# Patient Record
Sex: Male | Born: 1990 | Race: White | Hispanic: No | Marital: Single | Smoking: Never smoker
Health system: Southern US, Community
[De-identification: ages and names within clinical notes are randomized; demographics above are authoritative.]

## PROBLEM LIST (undated history)

## (undated) DIAGNOSIS — E063 Autoimmune thyroiditis: Secondary | ICD-10-CM

## (undated) DIAGNOSIS — E039 Hypothyroidism, unspecified: Secondary | ICD-10-CM

## (undated) DIAGNOSIS — E230 Hypopituitarism: Secondary | ICD-10-CM

## (undated) DIAGNOSIS — E049 Nontoxic goiter, unspecified: Secondary | ICD-10-CM

## (undated) HISTORY — DX: Hypopituitarism: E23.0

## (undated) HISTORY — DX: Autoimmune thyroiditis: E06.3

## (undated) HISTORY — PX: ADENOIDECTOMY: SUR15

## (undated) HISTORY — DX: Nontoxic goiter, unspecified: E04.9

## (undated) HISTORY — PX: TYMPANOSTOMY TUBE PLACEMENT: SHX32

## (undated) HISTORY — DX: Hypothyroidism, unspecified: E03.9

---
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
## 2009-06-03 IMAGING — CR DG BONE AGE
1 series · 1 of 1 positions shown · non-contrast
Comparison: 06/16/2006

CLINICAL DATA: Growth delay.  Growth hormone deficiency.

BONE AGE
TECHNIQUE: AP radiographs of the hand and wrist are correlated
with the developmental standards of Greulich and Pyle.

[x hand pa left]
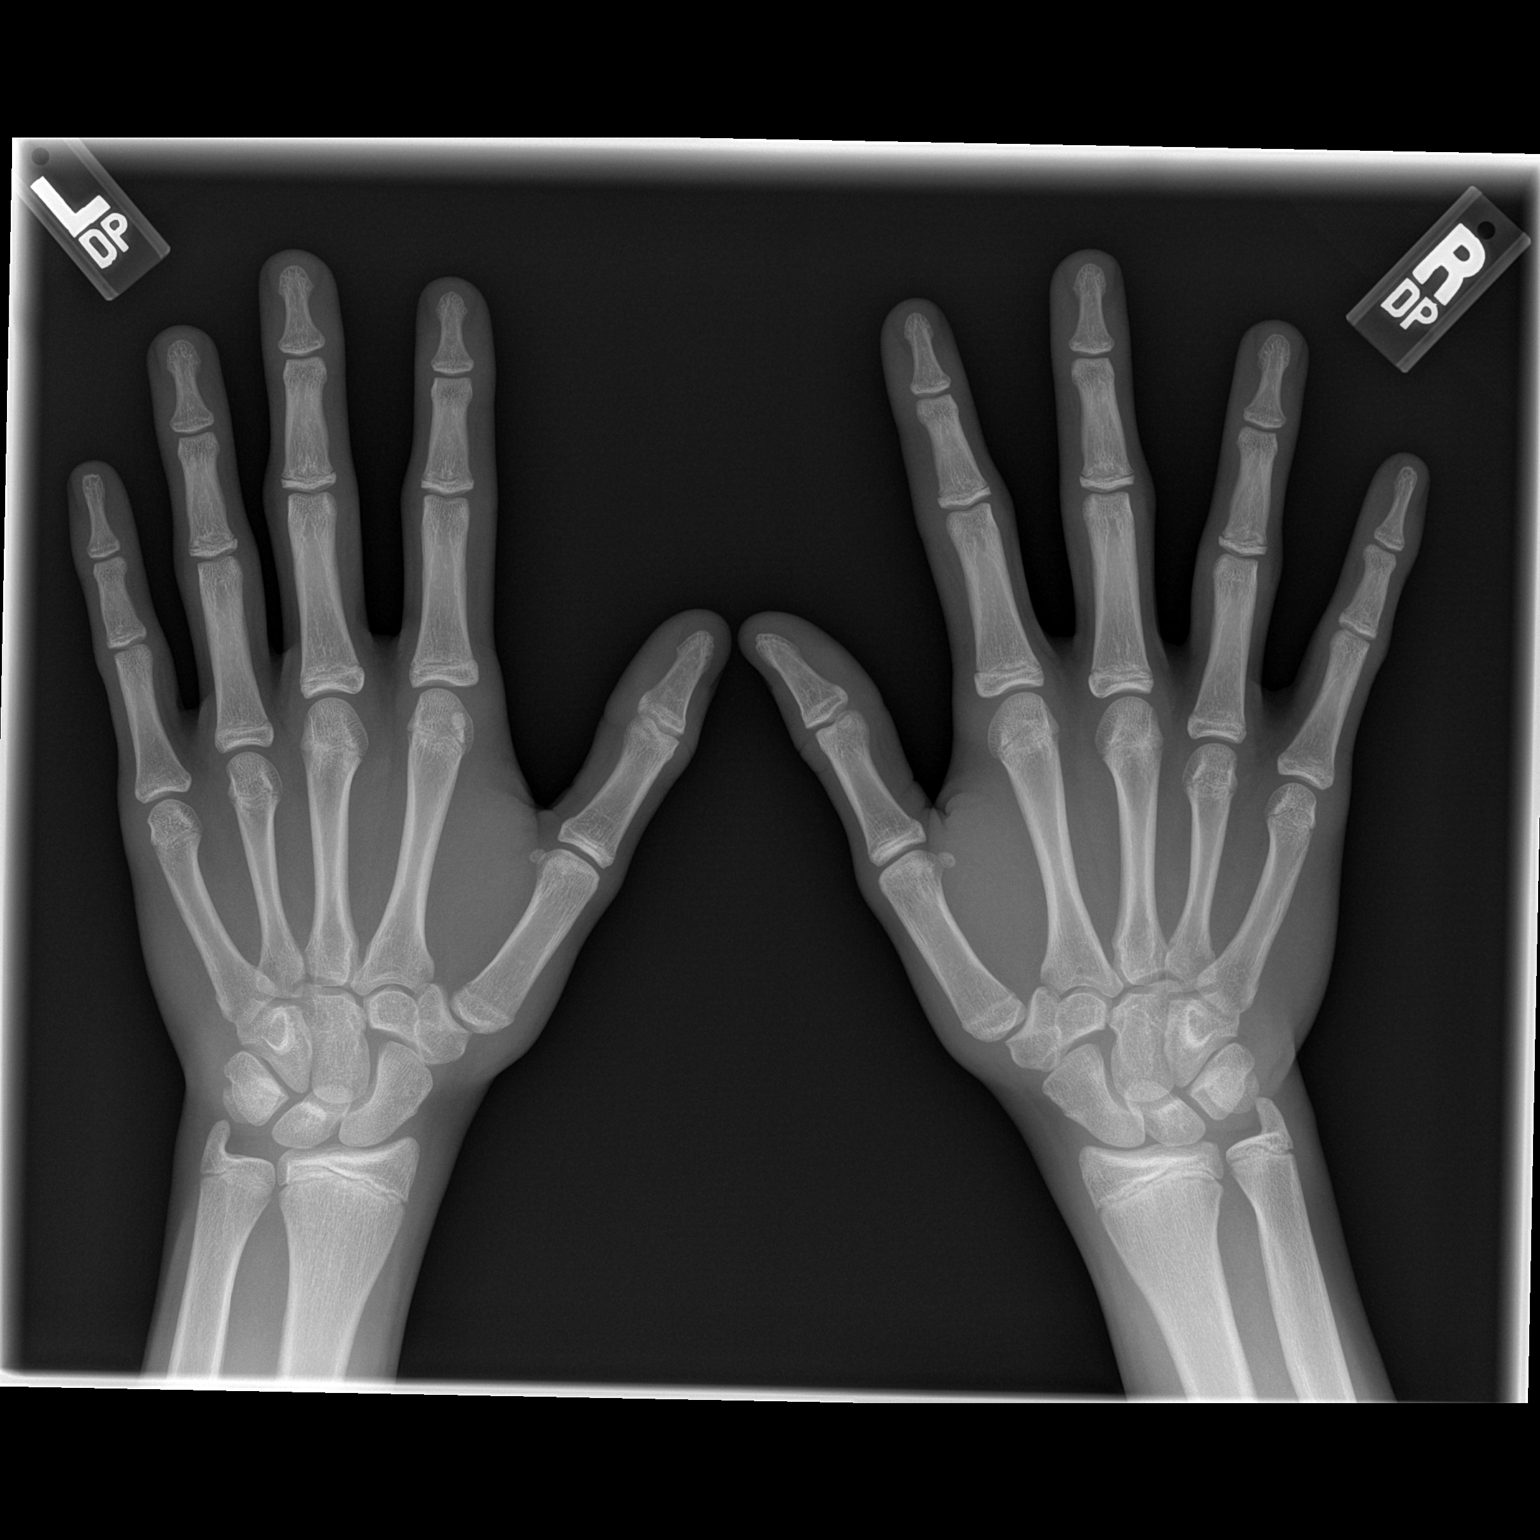

[1 of 1 positions shown; findings below may reference images not displayed]

FINDINGS: The chronological age of the patient is 17 years.  The
skeletal age most closely corresponds to that at the a 15-year-7-
month male. The standard deviation for an 18-year-old male is
approximately 16 months.  Therefore the current bone age is at the
limits of two standard deviations below the norm for chronological
age.
IMPRESSION: Skeletal age is barely within two standard deviations for the
chronological age.

## 2009-06-26 ENCOUNTER — Ambulatory Visit: Payer: Self-pay | Admitting: "Endocrinology

## 2010-01-22 ENCOUNTER — Ambulatory Visit: Payer: Self-pay | Admitting: "Endocrinology

## 2010-07-31 ENCOUNTER — Encounter: Payer: Self-pay | Admitting: Pediatrics

## 2010-07-31 DIAGNOSIS — E038 Other specified hypothyroidism: Secondary | ICD-10-CM

## 2010-08-13 ENCOUNTER — Other Ambulatory Visit: Payer: Self-pay | Admitting: "Endocrinology

## 2010-08-13 LAB — CLIENT PROFILE 3332
Free T4: 1.26 ng/dL (ref 0.80–1.80)
T3, Free: 3.2 pg/mL (ref 2.3–4.2)
TSH: 3.922 u[IU]/mL (ref 0.350–4.500)

## 2010-08-18 ENCOUNTER — Ambulatory Visit (INDEPENDENT_AMBULATORY_CARE_PROVIDER_SITE_OTHER): Payer: PRIVATE HEALTH INSURANCE | Admitting: "Endocrinology

## 2010-08-18 ENCOUNTER — Encounter: Payer: Self-pay | Admitting: "Endocrinology

## 2010-08-18 VITALS — BP 122/59 | HR 65 | Wt 157.4 lb

## 2010-08-18 DIAGNOSIS — E038 Other specified hypothyroidism: Secondary | ICD-10-CM

## 2010-08-18 DIAGNOSIS — E063 Autoimmune thyroiditis: Secondary | ICD-10-CM

## 2010-08-18 DIAGNOSIS — E049 Nontoxic goiter, unspecified: Secondary | ICD-10-CM

## 2010-08-18 DIAGNOSIS — E230 Hypopituitarism: Secondary | ICD-10-CM | POA: Insufficient documentation

## 2010-08-18 MED ORDER — LEVOTHYROXINE SODIUM 25 MCG PO TABS: 25.0000 ug | ORAL_TABLET | Freq: Every day | ORAL | Status: DC

## 2010-08-18 NOTE — Patient Instructions (Signed)
Please have thyroid lab tests drawn in early September and about one week prior to next appointment in December-January.

## 2010-08-18 NOTE — Progress Notes (Addendum)
Chief complaint: Followup hypothyroid, Hashimoto's thyroiditis, oiter, prior growth hormone deficiency.   History of present illness: The patient is a 20 year old white male. He was accompanied by his mother. 1. The patient was referred to me on 12/26/03 for evaluation of growth delay by his then pediatrician Dr. Williemae Area. The patient was 20 years old. On exam his height was at less than the 5th percentile. His weight was also less than the 5th percentile. He had a 20-25 gram goiter. His pubertal development was relatively delayed for his chronologic age. His thyroid function tests were normal. His bone age was 60 at a chronologic age of 34. His IGF-1 was 233 with normals being 202-957. His testosterone was 64.55, which was consistent with his early pubertal status. Over the next 6 months he grew very little. Growth hormone stimulation test done on 08/24/04 showed a initial growth hormone value of 0.10 and a peak growth hormone value of 0.56 after stimulation by levodopa. A second growth hormone stimulation test on 09/16/04 showed a baseline growth hormone of 0.26 and a stimulated value of 6.97, using levodopa and propranolol as a stimulating agent. 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 He was started on growth hormone in late 2006. In January 2007 the patient was also mildly hypothyroid. At that point he was started on Synthroid 25 mcg per day. The patient continued growth hormone treatment with gradually increasing doses of growth hormone up until 02/28/08. At that point he was a senior in high school. He had achieved a height growth of 69 inches. He decided it was time to stop growth hormone. On 06/26/09,I saw the patient in followup. Since he had not required a dose increase of Synthroid beyond 25 mcg over the preceding 4 years, we elected to stop  the Synthroid to see if perhaps he would make more thyroid hormone on his own. By 12/31/09 his thyroid function tests showed a TSH of 1.921, free T4 of 1.08, and free T3 of 2.9. At that point we agreed to continue him off Synthroid to see whether he would require Synthroid in the future. 2. The patient's last visit here was 5.19.11. In the interim he has been healthy. He had laboratory tests performed on 0 7.0 5.12.  3. PROS: Constitutional: The patient feels well, is healthy, and has no significant complaints. His energy is fine. He sleeps well. His body temperature is normal. Eyes: Vision is good. There are no significant eye complaints. Neck: The patient has no complaints of anterior neck swelling, soreness, tenderness,  pressure, discomfort, or difficulty swallowing.  Heart: Heart rate increases with exercise or other physical activity. The patient has no complaints of palpitations, irregular heat beats, chest pain, or chest pressure. Gastrointestinal: Bowel movents seem normal. The patient has no complaints of excessive hunger, acid reflux, upset stomach, stomach aches or pains, diarrhea, or constipation. Legs: Muscle mass and strength seem normal. There are no complaints of numbness, tingling, burning, or pain. No edema is noted. Feet: There are no obvious foot problems. There are no complaints of numbness, tingling, burning, or pain. No edema is noted.  PMFSH: 1. The patient is entering his third year at [employer]. He is Glass blower/designer in Dance movement psychotherapist.  2. He is currently working out 6 days per week. 3 days per week he goes to  the gymnasium and works on his upper body work. The other 3 days per week he runs.  PHYSICAL EXAMINATION:  Vital signs: BP: 102/59, HR: 59 Weight: 157 lbs (6.4 lb increase since 5.19.11. Constitutional: The patient looks healthy and appears physically and emotionally well.  Eyes: There is no arcus or proptosis.  Mouth: The oropharynx appears  normal. The tongue appears normal. There is normal oral moisture. There is no obvious gingivitis. Neck: There are no bruits present. The thyroid gland appears normal in size. The thyroid gland is approximately 25-30 grams in size. The consistency of the thyroid gland is normal. There is no thyroid tenderness to palpation. Lungs: The lungs are clear. Air movement is good. Heart: The heart rhythm and rate appear normal. Heart sounds S1 and S2 are normal. I do not appreciate any pathologic heart murmurs. Abdomen: The abdominal size is normal. Bowel sounds are normal. The abdomen is soft and non-tender. There is no obviously palpable hepatomegaly, splenomegaly, or other masses.  Arms: Muscle mass appears appropriate for age. Hands: There is no obvious tremor. Phalangeal and metacarpophalangeal joints appear normal. Palms are normal. Legs: Muscle mass appears appropriate for age. There is no edema.  Neurologic: Muscle strength is normal for age and gender  in both the upper and the lower extremities. Muscle tone appears normal. Sensation to touch is normal in the legs.  LABS 08/13/10: TSH was 3.922, free T4 was 1.26, and free T3 was 3.2.   ASSESSMENT: 1. Goiter: The thyroid gland is slightly larger today. 2. Hypothyroid: By the laboratory tests from this past week, the patient has compensated or mild hypothyroidism. I recommended resuming Synthroid therapy. 3. Hashimoto's thyroiditis: This is currently clinically quiescent.  PLAN: 1. Diagnostic: Patient will have thyroid function tests performed in 2 and 6 months. 2. Therapeutic: Patient will begin treatment with 25 mcg of Synthroid per day. 3. Patient education: We discussed the typical course of Hashimoto's thyroiditis and the requirement for larger doses of thyroid hormone over time.  4. Follow-up: We will see the patient in followup appointment in 6 months.  Level of Service: This visit lasted in excess of 40 minutes. More than 50% of the visit  was devoted to counseling.

## 2010-11-13 ENCOUNTER — Other Ambulatory Visit: Payer: Self-pay | Admitting: "Endocrinology

## 2010-11-14 LAB — T4, FREE: Free T4: 1.38 ng/dL (ref 0.80–1.80)

## 2010-11-14 LAB — T3, FREE: T3, Free: 3.2 pg/mL (ref 2.3–4.2)

## 2010-11-14 LAB — TSH: TSH: 3.878 u[IU]/mL (ref 0.350–4.500)

## 2011-01-25 ENCOUNTER — Ambulatory Visit: Payer: PRIVATE HEALTH INSURANCE | Admitting: "Endocrinology

## 2011-02-04 ENCOUNTER — Other Ambulatory Visit: Payer: Self-pay | Admitting: "Endocrinology

## 2011-02-05 ENCOUNTER — Telehealth: Payer: Self-pay | Admitting: *Deleted

## 2011-02-05 LAB — T3, FREE: T3, Free: 3.3 pg/mL (ref 2.3–4.2)

## 2011-02-05 NOTE — Telephone Encounter (Signed)
See below note.

## 2011-02-10 ENCOUNTER — Other Ambulatory Visit: Payer: Self-pay | Admitting: "Endocrinology

## 2011-02-10 ENCOUNTER — Ambulatory Visit: Payer: PRIVATE HEALTH INSURANCE | Admitting: "Endocrinology

## 2011-03-01 ENCOUNTER — Ambulatory Visit: Payer: PRIVATE HEALTH INSURANCE | Admitting: "Endocrinology

## 2011-04-26 ENCOUNTER — Ambulatory Visit: Payer: PRIVATE HEALTH INSURANCE | Admitting: "Endocrinology

## 2011-05-18 ENCOUNTER — Other Ambulatory Visit: Payer: Self-pay | Admitting: *Deleted

## 2011-05-18 DIAGNOSIS — E038 Other specified hypothyroidism: Secondary | ICD-10-CM

## 2011-05-19 ENCOUNTER — Other Ambulatory Visit: Payer: Self-pay | Admitting: "Endocrinology

## 2011-06-23 LAB — TSH: TSH: 1.446 u[IU]/mL (ref 0.350–4.500)

## 2011-06-23 LAB — T4, FREE: Free T4: 1.4 ng/dL (ref 0.80–1.80)

## 2011-06-30 ENCOUNTER — Ambulatory Visit (INDEPENDENT_AMBULATORY_CARE_PROVIDER_SITE_OTHER): Payer: BC Managed Care – PPO | Admitting: "Endocrinology

## 2011-06-30 ENCOUNTER — Encounter: Payer: Self-pay | Admitting: "Endocrinology

## 2011-06-30 VITALS — BP 118/68 | HR 61 | Wt 161.4 lb

## 2011-06-30 DIAGNOSIS — E038 Other specified hypothyroidism: Secondary | ICD-10-CM

## 2011-06-30 DIAGNOSIS — E049 Nontoxic goiter, unspecified: Secondary | ICD-10-CM

## 2011-06-30 DIAGNOSIS — E063 Autoimmune thyroiditis: Secondary | ICD-10-CM

## 2011-06-30 NOTE — Patient Instructions (Signed)
Followup visit in one year. Repeat thyroid function tests in 6 and 12 months. Continue current dose of Synthroid at 37.5 mcg per day.

## 2011-06-30 NOTE — Progress Notes (Signed)
Chief complaint: Follow-up hypothyroid, Hashimoto's thyroiditis, goiter, and prior growth hormone deficiency.   History of present illness: The patient is a 21 year old white male. He was accompanied by his mother. 1. The patient was referred to me on 12/26/03 for evaluation of growth delay by his then pediatrician Dr. Williemae Area. The patient was 21 years old. On exam his height was at less than the 5th percentile. His weight was also less than the 5th percentile. He had a 20-25 gram goiter. His pubertal development was relatively delayed for his chronologic age. His thyroid function tests were normal. His bone age was 78 at a chronologic age of 3. His IGF-1 was 233 with normals being 202-957. His testosterone was 64.55, which was consistent with his early pubertal status. Over the next 6 months he grew very little. A growth hormone stimulation test done on 08/24/04 showed a initial growth hormone value of 0.10 and a peak growth hormone value of 0.56 after stimulation by levodopa. A second growth hormone stimulation test on 09/16/04 showed a baseline growth hormone of 0.26 and a stimulated value of 6.97, using levodopa and propranolol as a stimulating agent. 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 He was started on growth hormone in late 2006. In January 2007 the patient was also mildly hypothyroid. At that point he was started on Synthroid 25 mcg per day. The patient continued growth hormone treatment with gradually increasing doses of growth hormone up until 02/28/08. At that point he was a senior in high school. He had achieved a height growth of 69 inches. He decided it was time to stop growth hormone. On 06/26/09, I saw the patient in follow-up. Since he had not required a dose increase of Synthroid beyond 25 mcg over the preceding 4 years, we  elected to stop the Synthroid to see if perhaps he would make more thyroid hormone on his own. By 12/31/09 his thyroid function tests showed a TSH of 1.921, free T4 of 1.08, and free T3 of 2.9. At that point we agreed to continue him off Synthroid to see whether he would require Synthroid in the future. 2. The patient's last visit here was 7.10.12. His labs from 08/13/10 showed a TSH of 3.922, free T4 1.26, and free T3 3.2. We started him on Synthroid 25 mcg/day. On 12/03/10  His TSH was 3.878, free T4 1.38, and free T3 3.2. We increased the Synthroid dose to 37.5 mcg/day. In the interim he has been healthy.   3. Pertinent Review of Systems: Constitutional: The patient feels "well", is healthy, and has no significant complaints. His energy is fine. He sleeps well. His body temperature is normal. Eyes: Vision is good. There are no significant eye complaints. Neck: The patient has no complaints of anterior neck swelling, soreness, tenderness,  pressure, discomfort, or difficulty swallowing.  Heart: Heart rate increases with exercise or other physical activity. The patient has no complaints of palpitations, irregular heat beats, chest pain, or chest pressure. Gastrointestinal: Bowel movents seem normal. The patient has no complaints of excessive hunger, acid reflux, upset stomach, stomach aches or pains, diarrhea, or constipation. Legs: Muscle mass and strength seem normal. There are no complaints of numbness, tingling, burning, or pain. No edema is noted. Feet: There are no obvious foot problems. There are no complaints of numbness, tingling, burning, or pain.  No edema is noted.  PMFSH: 1. School and work: The patient is finishing his third year at [employer]. 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   2. Activities: He has resumed weight training and cardio recently. He did not exercise much during the academic year.  3. Primary care  provider: Student health  PHYSICAL EXAMINATION:  Vital signs: BP: 118/68, HR: 61 Weight: 161 lbs, 6.4 oz Constitutional: The patient looks healthy and appears physically and emotionally well.  Eyes: There is no arcus or proptosis.  Mouth: The oropharynx appears normal. The tongue appears normal. There is normal oral moisture. There is no obvious gingivitis. Neck: There are no bruits present. The thyroid gland appears normal in size. The thyroid gland is approximately 20-25 grams in size. The consistency of the thyroid gland is relatively firm. There is no thyroid tenderness to palpation. Lungs: The lungs are clear. Air movement is good. Heart: The heart rhythm and rate appear normal. Heart sounds S1 and S2 are normal. I do not appreciate any pathologic heart murmurs. Abdomen: The abdominal size is normal. Bowel sounds are normal. The abdomen is soft and non-tender. There is no obviously palpable hepatomegaly, splenomegaly, or other masses.  Arms: Muscle mass appears appropriate for age. Hands: There is no obvious tremor. Phalangeal and metacarpophalangeal joints appear normal. Palms are normal. Legs: Muscle mass appears appropriate for age. There is no edema.  Neurologic: Muscle strength is normal for age and gender  in both the upper and the lower extremities. Muscle tone appears normal. Sensation to touch is normal in the legs.  LABS: 06/22/11: TSH 1.446, free T4 1.40, free T3 3.1                 08/13/10: TSH 3.922, free T4 1.26, free T3 3.2.   ASSESSMENT: 1. Goiter: The thyroid gland is slightly smaller today. 2. Hypothyroid: By the laboratory tests from this past week, the patient is mid-range euthyroid on his current Synthroid dose of 37.5 mcg/day. 3. Hashimoto's thyroiditis: This is currently clinically quiescent.  PLAN: 1. Diagnostic: Patient will have thyroid function tests performed in 6 and 12 months. 2. Therapeutic: Patient will continue treatment with 37.5 mcg/day of  Synthroid. 3. Patient education: We discussed the typical course of Hashimoto's thyroiditis and the requirement for larger doses of thyroid hormone over time.  4. Follow-up: We will see the patient in follow-up appointment in 12 months.  Level of Service: This visit lasted in excess of 40 minutes. More than 50% of the visit was devoted to counseling.  Russell Meadows

## 2012-01-26 ENCOUNTER — Other Ambulatory Visit: Payer: Self-pay | Admitting: *Deleted

## 2012-01-26 DIAGNOSIS — E038 Other specified hypothyroidism: Secondary | ICD-10-CM

## 2012-01-26 LAB — TSH: TSH: 1.842 u[IU]/mL (ref 0.350–4.500)

## 2012-01-26 LAB — T4, FREE: Free T4: 1.48 ng/dL (ref 0.80–1.80)

## 2012-01-26 LAB — T3, FREE: T3, Free: 3.2 pg/mL (ref 2.3–4.2)

## 2012-04-19 ENCOUNTER — Telehealth: Payer: Self-pay | Admitting: "Endocrinology

## 2012-04-19 DIAGNOSIS — E063 Autoimmune thyroiditis: Secondary | ICD-10-CM

## 2012-04-19 DIAGNOSIS — R109 Unspecified abdominal pain: Secondary | ICD-10-CM

## 2012-04-19 LAB — COMPREHENSIVE METABOLIC PANEL
AST: 20 U/L (ref 0–37)
Albumin: 4.6 g/dL (ref 3.5–5.2)
Alkaline Phosphatase: 78 U/L (ref 39–117)
BUN: 14 mg/dL (ref 6–23)
Calcium: 9.3 mg/dL (ref 8.4–10.5)
Chloride: 104 mEq/L (ref 96–112)
Creat: 0.92 mg/dL (ref 0.50–1.35)
Glucose, Bld: 96 mg/dL (ref 70–99)
Total Protein: 6.7 g/dL (ref 6.0–8.3)

## 2012-04-19 NOTE — Telephone Encounter (Signed)
1. Russell Meadows called. For the past 2 months his stomach and digestive system has been out of whack. His appetite has decreased by 50%.  He gets full and bloated easily. He is frequently nauseated. He also has excessive stomach gas.  He is in [place] on spring break. He remains on Synthroid, 37.5 mcg/day. He has not been drinking a lot of alcohol. He is not on any OTC meds. He needs to have TFTs drawn.  2. I'll order the TFTs now. He will have them drawn this afternoon.  David Stall

## 2012-04-20 LAB — TSH: TSH: 1.761 u[IU]/mL (ref 0.350–4.500)

## 2012-04-20 LAB — T3, FREE: T3, Free: 3.3 pg/mL (ref 2.3–4.2)

## 2012-04-27 ENCOUNTER — Ambulatory Visit: Payer: BC Managed Care – PPO | Admitting: "Endocrinology

## 2012-05-22 ENCOUNTER — Telehealth: Payer: Self-pay | Admitting: "Endocrinology

## 2012-05-22 NOTE — Telephone Encounter (Signed)
1. Dr. Dallie Dad, a gastroenterologist in [facility] called our office this afternoon and requested that I call him on his cell phone, [phone]. 2. I returned his call. Dr. Conley Rolls has been evaluating Romeo Apple for nausea and abdominal pain. Hew has performed an EGD with bowel biopsy and serologies for celiac disease, all of which were negative. H wonders if Romeo Apple could have an endocrine cause of his symptoms. I told Dr. Conley Rolls that Addison's disease is a possible cause of his symptoms. Dr. Renella Cunas asked me to work Romeo Apple up for Addison's disease and I concurred.  3. I attempted to contact Ben on his cell phone, but the number we have is for his mother's cell phone. Knowing that mom had gone to Dr. Irwin Brakeman office with Romeo Apple on several occasions and that Romeo Apple keeps mom informed about his health issues, I asked mom for Ben's cell number and answered her questions. I told her that Dr. Conley Rolls had asked me to evaluate [facility] for possible Addison's disease and that I was trying to get in touch with Romeo Apple to start that evaluation. Mom had several questions about Addison's disease which I answered. Mom said that Wynona had begun to complain of nausea and abdominal pain around Christmas, but these symptoms have been much worse in the past month. She said that he has lost weight, but she is not sure how much. Ben's cell phone number is [phone]. 4. I called Ben, but he was not available. I left a VM msg asking him to return my call. David Stall

## 2012-05-22 NOTE — Telephone Encounter (Signed)
Ben returned my call. We discussed Dr. Irwin Brakeman request to evaluate [facility] for Addison's disease. Russell Meadows is at home this week on spring break. I will see him on Friday at 10 AM. I asked him to stop by the office tomorrow to pick up lab slips for fasting labs to be done on Wednesday and Thursday mornings. I will try to arrange an ACTH stim test for Thursday morning. Russell Meadows

## 2012-05-23 ENCOUNTER — Other Ambulatory Visit: Payer: Self-pay | Admitting: *Deleted

## 2012-05-23 DIAGNOSIS — E038 Other specified hypothyroidism: Secondary | ICD-10-CM

## 2012-05-23 DIAGNOSIS — R5383 Other fatigue: Secondary | ICD-10-CM

## 2012-05-24 LAB — LIPID PANEL
Cholesterol: 171 mg/dL (ref 0–200)
LDL Cholesterol: 112 mg/dL — ABNORMAL HIGH (ref 0–99)
VLDL: 15 mg/dL (ref 0–40)

## 2012-05-24 LAB — COMPREHENSIVE METABOLIC PANEL
AST: 21 U/L (ref 0–37)
Albumin: 4.5 g/dL (ref 3.5–5.2)
Alkaline Phosphatase: 68 U/L (ref 39–117)
BUN: 15 mg/dL (ref 6–23)
Potassium: 4 mEq/L (ref 3.5–5.3)
Sodium: 141 mEq/L (ref 135–145)
Total Protein: 7 g/dL (ref 6.0–8.3)

## 2012-05-24 LAB — CBC WITH DIFFERENTIAL/PLATELET
Basophils Relative: 1 % (ref 0–1)
Eosinophils Absolute: 0.3 10*3/uL (ref 0.0–0.7)
Eosinophils Relative: 4 % (ref 0–5)
Hemoglobin: 15.3 g/dL (ref 13.0–17.0)
MCH: 31.1 pg (ref 26.0–34.0)
MCHC: 35.7 g/dL (ref 30.0–36.0)
MCV: 87.2 fL (ref 78.0–100.0)
Monocytes Absolute: 0.6 10*3/uL (ref 0.1–1.0)
Monocytes Relative: 9 % (ref 3–12)
Neutrophils Relative %: 59 % (ref 43–77)

## 2012-05-24 LAB — T3, FREE: T3, Free: 3.3 pg/mL (ref 2.3–4.2)

## 2012-05-24 LAB — T4, FREE: Free T4: 1.53 ng/dL (ref 0.80–1.80)

## 2012-05-25 ENCOUNTER — Other Ambulatory Visit (HOSPITAL_COMMUNITY): Payer: Self-pay | Admitting: *Deleted

## 2012-05-25 ENCOUNTER — Ambulatory Visit (INDEPENDENT_AMBULATORY_CARE_PROVIDER_SITE_OTHER): Payer: BC Managed Care – PPO | Admitting: "Endocrinology

## 2012-05-25 ENCOUNTER — Encounter: Payer: Self-pay | Admitting: "Endocrinology

## 2012-05-25 VITALS — BP 115/72 | HR 71 | Wt 157.8 lb

## 2012-05-25 DIAGNOSIS — E049 Nontoxic goiter, unspecified: Secondary | ICD-10-CM

## 2012-05-25 DIAGNOSIS — R110 Nausea: Secondary | ICD-10-CM

## 2012-05-25 DIAGNOSIS — R1013 Epigastric pain: Secondary | ICD-10-CM

## 2012-05-25 DIAGNOSIS — R109 Unspecified abdominal pain: Secondary | ICD-10-CM

## 2012-05-25 DIAGNOSIS — R630 Anorexia: Secondary | ICD-10-CM

## 2012-05-25 DIAGNOSIS — E039 Hypothyroidism, unspecified: Secondary | ICD-10-CM

## 2012-05-25 DIAGNOSIS — G8929 Other chronic pain: Secondary | ICD-10-CM

## 2012-05-25 DIAGNOSIS — R142 Eructation: Secondary | ICD-10-CM

## 2012-05-25 DIAGNOSIS — R634 Abnormal weight loss: Secondary | ICD-10-CM

## 2012-05-25 DIAGNOSIS — E063 Autoimmune thyroiditis: Secondary | ICD-10-CM

## 2012-05-25 DIAGNOSIS — R140 Abdominal distension (gaseous): Secondary | ICD-10-CM

## 2012-05-25 DIAGNOSIS — E038 Other specified hypothyroidism: Secondary | ICD-10-CM

## 2012-05-25 MED ORDER — OMEPRAZOLE 20 MG PO CPDR: DELAYED_RELEASE_CAPSULE | ORAL | Status: DC

## 2012-05-25 NOTE — Patient Instructions (Signed)
Follow up visit in late July or early August.

## 2012-05-25 NOTE — Progress Notes (Signed)
Chief complaint: Follow-up hypothyroidism, Hashimoto's thyroiditis, goiter, and new problems as listed below  History of present illness: The patient is a 22 year old white male. He was accompanied by his mother.  1. The patient was referred to me on 12/26/03 for evaluation of growth delay by his then pediatrician Dr. Williemae Area. The patient was 22 years old. On exam his height was at less than the 5th percentile. His weight was also less than the 5th percentile. He had a 20-25 gram goiter. His pubertal development was relatively delayed for his chronologic age. His thyroid function tests were normal. His bone age was 40 at a chronologic age of 76. His IGF-1 was 233 with normals being 202-957. His testosterone was 64.55, which was consistent with his early pubertal status. Over the next 6 months he grew very little. A growth hormone stimulation test done on 08/24/04 showed a initial growth hormone value of 0.10 and a peak growth hormone value of 0.56 after stimulation by levodopa. A second growth hormone stimulation test on 09/16/04 showed a baseline growth hormone of 0.26 and a stimulated value of 6.97, using levodopa and propranolol as a stimulating agent. 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 He was started on growth hormone in late 2006. In January 2007 the patient was also mildly hypothyroid. At that point he was started on Synthroid 25 mcg per day. The patient continued growth hormone treatment with gradually increasing doses of growth hormone up until 02/28/08. At that point he was a senior in high school. He had achieved a height growth of 69 inches. He decided it was time to stop growth hormone. On 06/26/09, I saw the patient in follow-up. Since he had not required a dose increase of Synthroid beyond 25 mcg over the preceding 4 years, we  elected to stop the Synthroid to see if perhaps he would make more thyroid hormone on his own. By 12/31/09 his thyroid function tests showed a TSH of 1.921, free T4 of 1.08, and free T3 of 2.9. At that point we agreed to continue him off Synthroid to see whether he would require Synthroid in the future. Later, however, at his visit on 7/10.12 his TSH increased again and I re-started him on Synthroid.  2. The patient's last visit here was 06/30/11. He was taking 37.5 mcg of Synthroid at that time. In the interim he had been healthy until late December or really January. He then developed stomach aches, bloating, decreased appetite, nausea, and minor fatigue. The nausea has resolved, but the other symptoms have continued, although they may be a little better. He only eats about half of what he was eating one year ago.  A. Stomach pains: These are epigastric. Pains usually occur in the noon-late afternoon time period, about 3 hours after the lunch meal. If he skips lunch, he will have belly hunger. If he eats lunch he may feel better initially, but later will still feel bloated, as if something is in his stomach, and then the belly hunger returns. He occasionally has similar symptoms about 3 hours after the dinner meal.  Sometimes the stomach pains after lunch resolve by the time of the supper meal. Usually he feels good enough to eat supper. Sometimes the supper meal seems to end the stomach pains. Sometimes the stomach pains slowly resolve after supper and are  gone by bedtime.  He was evaluated by a gastroenterologist, Dr.Le in [facility]. Dr. Conley Rolls performed an EGD and ordered serology testing for celiac disease, which was negative. Dr. Conley Rolls put [place] on Dexilant for 2 weeks. The Dexilant helped some, but not enough. He has slight relief of bloating, but no relief of belly hunger and stomach pains.   B. Bloating: he feels bloating in the periumbilical area. The bloating is not present upon awakening, but occurs after  most meals, especially after lunch. He does not have bloating after light snacks.   C. Decreased appetite. His appetite has decreased by about 50% or more. Although he still has belly hunger, his brain tells him to back off on eating in order to avoid symptoms.   D. Weight loss:  On his home scale his weight has dropped from 160 to 153 in the past 2 months or so.    E. Nausea: Nausea occurs in parallel with the stomach pains and belly hunger. Sometimes the nausea improves after he eats supper.   F. "Minor fatigue": Even if he has a good night's sleep, he awakens tired in the mornings on most days. Once he get going his energy improves. About 1-2 times per week he takes a nap in the afternoon.   G. Evaluation to date:  His GI, Dr. Conley Rolls, has done an EGD, stomach and small bowl biopsies, celiac panel, CBC. To date, no diagnosis has been made. Dr. Conley Rolls called me and asked if there were any endocrine issues that could cause his symptoms.  I suggested that Addison's Dz was a possible cause. I offered to evaluate Romeo Apple for Addison;s Dz and Dr. Conley Rolls thanked me.   H. His only medication is Synthroid. He is not taking any colon cleansers, vitamins, minerals, or any other OTC products.   3. Pertinent Review of Systems: Constitutional: The patient feels "slightly bloated and a little more tired" than he thinks he should be. He does not feel like eating very much. His body temperature is normal. Eyes: Vision is good. There are no significant eye complaints. Neck: The patient has no complaints of anterior neck swelling, soreness, tenderness,  pressure, discomfort, or difficulty swallowing.  Heart: Heart rate increases with exercise or other physical activity. The patient has no complaints of palpitations, irregular heat beats, chest pain, or chest pressure. Gastrointestinal: Bowel movents have decreased in frequency to about every other day. Stools are usually normal in size, but are sometimes small. He had some diarrhea  last Sunday.  The patient has had more belching and burping than usual, but no reflux or other abdominal pains.  Legs: Muscle mass and strength seem normal. There are no complaints of numbness, tingling, burning, or pain. No edema is noted. Feet: There are no obvious foot problems. There are no complaints of numbness, tingling, burning, or pain. No edema is noted. Skin: He has a red rash on both cheeks "forever". The rash has been evaluated by a dermatologist. The dermatologist put him on antibiotics for a while, but told the family it was not a big deal.  PAST MEDICAL, FAMILY, AND SOCIAL HISTORY: 1. School and work: The patient is finishing his fourth year at [employer]. 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  Dad has high cholesterol. 2. Activities: He has not been feeling good enough to go back to the gym. He only walks between classes.   3. Primary care provider: Student health  4. GI: Dr. Devona Konig, [facility]  PHYSICAL EXAMINATION:  Vital signs: BP: 115/72, HR: 71 Weight: 157 lbs and 12.8 oz, compared with 161 lbs, and 6.4 oz 11 months ago. Constitutional: The patient looks healthy and appears physically and emotionally well.  Eyes: There is no arcus or proptosis.  Face: He has a red rash that involves his sideburns areas, comes about halfway down his jaw line, and then ascends to his perinasal areas.  Mouth: The oropharynx appears normal. The tongue appears normal. There is normal oral moisture. There is no obvious gingivitis. Neck: There are no bruits present. The thyroid gland appears normal in size. The thyroid gland is approximately 20-25 grams in size. The consistency of the thyroid gland is relatively normal. There is no thyroid tenderness to palpation. Lungs: The lungs are clear. Air movement is good. Heart: The heart rhythm and rate appear normal. Heart sounds S1 and S2 are normal. I do not  appreciate any pathologic heart murmurs. Abdomen: The abdominal size is normal. Bowel sounds are normal. The abdomen is soft. He is fairly tender to deep palpation in the epigastrium and a little tender at the junctions of the epigastrium with the RUQ and LUQ and in the periumbilical areas. There is no obviously palpable hepatomegaly, splenomegaly, or other masses.  Arms: Muscle mass appears appropriate for age. Hands: There is no obvious tremor. Phalangeal and metacarpophalangeal joints appear normal. Palms are normal. Nails are a bit pallid. Legs: Muscle mass appears appropriate for age. There is no edema.  Neurologic: Muscle strength is normal for age and gender  in both the upper and the lower extremities. Muscle tone appears normal. Sensation to touch is normal in the legs.Skin: he is pale except for the rash on his face. His oral mucosa and palms are not hyperpigmented.   LABS:  05/24/12: TSH 1.795, free T4 1.53, free T3 3.3; CMP normal; CBC normal; cholesterol 171, triglycerides 77, HDL 44, LDL 112 06/22/11: TSH 1.446, free T4 1.40, free T3 3.1     08/13/10: TSH 3.922, free T4 1.26, free T3 3.2.   ASSESSMENT: 1. Goiter: The thyroid gland is about the same size as last year.  2. Hypothyroid: By the laboratory tests from this past week, the patient is mid-range euthyroid on his current Synthroid dose of 37.5 mcg/day. 3. Hashimoto's thyroiditis: This problem is currently clinically quiescent. 4. His symptom complex of stomach pains/dyspepsia, nausea, bloating, decreased appetite and some weight loss really sound as if they should be associated with the GI system. His evaluation to date has not shown any specific GI problem. Could he have a variant form of celiac disease with normal antibody screen? Although I agree with Dr. Conley Rolls that some of his symptoms could be due to Addison's Dz, many other symptoms do not fit that diagnosis. While it is unlikely that he has Addison's disease, we must do our best  to rule out that possibility.  PLAN: 1. Diagnostic: ACTH stimulation test tomorrow morning. Repeat TFTs in 12 months. 2. Therapeutic: Patient will continue treatment with 37.5 mcg/day of Synthroid. I suggested that he try omeprazole, 20 mg, twice daily for 4-6 weeks to see if his symptoms improve. If not, then he may need to see the GI motility specialist at [facility]. A 69-month trial of a gluten-free diet might be third option.   3. Patient education: We discussed the typical course of Hashimoto's thyroiditis and the requirement for larger doses of thyroid hormone over time.  4. Follow-up:  We will see the patient in follow-up appointment in 12 months.  Level of Service: This visit lasted in excess of 40 minutes. More than 50% of the visit was devoted to counseling.  David Stall

## 2012-05-26 ENCOUNTER — Encounter (HOSPITAL_COMMUNITY)
Admission: RE | Admit: 2012-05-26 | Discharge: 2012-05-26 | Disposition: A | Discharge status: Home / Self Care | Payer: BC Managed Care – PPO | Source: Ambulatory Visit | Attending: "Endocrinology | Admitting: "Endocrinology

## 2012-05-26 DIAGNOSIS — R630 Anorexia: Secondary | ICD-10-CM | POA: Insufficient documentation

## 2012-05-26 DIAGNOSIS — R634 Abnormal weight loss: Secondary | ICD-10-CM | POA: Insufficient documentation

## 2012-05-26 DIAGNOSIS — R140 Abdominal distension (gaseous): Secondary | ICD-10-CM | POA: Insufficient documentation

## 2012-05-26 DIAGNOSIS — E039 Hypothyroidism, unspecified: Secondary | ICD-10-CM | POA: Insufficient documentation

## 2012-05-26 DIAGNOSIS — R1013 Epigastric pain: Secondary | ICD-10-CM | POA: Insufficient documentation

## 2012-05-26 DIAGNOSIS — E063 Autoimmune thyroiditis: Secondary | ICD-10-CM | POA: Insufficient documentation

## 2012-05-26 DIAGNOSIS — R110 Nausea: Secondary | ICD-10-CM | POA: Insufficient documentation

## 2012-05-26 DIAGNOSIS — G8929 Other chronic pain: Secondary | ICD-10-CM | POA: Insufficient documentation

## 2012-05-26 MED ORDER — COSYNTROPIN 0.25 MG IJ SOLR
250.0000 ug | Freq: Once | INTRAMUSCULAR | Status: AC
  Administered 2012-05-26: 0.25 mg via INTRAVENOUS
  Filled 2012-05-26: qty 0.25

## 2012-05-29 ENCOUNTER — Encounter: Payer: Self-pay | Admitting: *Deleted

## 2012-05-29 LAB — ACTH: C206 ACTH: 22 pg/mL (ref 10–46)

## 2012-05-30 LAB — ACTH STIMULATION, 3 TIME POINTS
Cortisol, 30 Min: 22.9 ug/dL (ref >20)
Cortisol, Base: 17.7 ug/dL

## 2012-06-27 ENCOUNTER — Ambulatory Visit: Payer: BC Managed Care – PPO | Admitting: "Endocrinology

## 2012-08-11 ENCOUNTER — Other Ambulatory Visit: Payer: Self-pay | Admitting: "Endocrinology

## 2012-11-02 ENCOUNTER — Other Ambulatory Visit: Payer: Self-pay | Admitting: "Endocrinology

## 2013-05-09 ENCOUNTER — Other Ambulatory Visit: Payer: Self-pay | Admitting: "Endocrinology

## 2013-05-21 ENCOUNTER — Other Ambulatory Visit: Payer: Self-pay | Admitting: *Deleted

## 2013-05-21 DIAGNOSIS — E039 Hypothyroidism, unspecified: Secondary | ICD-10-CM

## 2013-05-22 ENCOUNTER — Other Ambulatory Visit: Payer: Self-pay | Admitting: *Deleted

## 2013-05-22 DIAGNOSIS — E038 Other specified hypothyroidism: Secondary | ICD-10-CM

## 2013-05-25 ENCOUNTER — Other Ambulatory Visit: Payer: Self-pay | Admitting: "Endocrinology

## 2013-05-27 LAB — T3, FREE: T3 FREE: 3.2 pg/mL (ref 2.3–4.2)

## 2013-05-27 LAB — TSH: TSH: 2.344 u[IU]/mL (ref 0.350–4.500)

## 2013-05-27 LAB — T4, FREE: FREE T4: 1.46 ng/dL (ref 0.80–1.80)

## 2013-05-29 ENCOUNTER — Encounter: Payer: Self-pay | Admitting: "Endocrinology

## 2013-05-29 ENCOUNTER — Ambulatory Visit (INDEPENDENT_AMBULATORY_CARE_PROVIDER_SITE_OTHER): Payer: BC Managed Care – PPO | Admitting: "Endocrinology

## 2013-05-29 VITALS — BP 111/68 | HR 65 | Wt 157.5 lb

## 2013-05-29 DIAGNOSIS — E038 Other specified hypothyroidism: Secondary | ICD-10-CM

## 2013-05-29 DIAGNOSIS — E063 Autoimmune thyroiditis: Secondary | ICD-10-CM

## 2013-05-29 DIAGNOSIS — E049 Nontoxic goiter, unspecified: Secondary | ICD-10-CM

## 2013-05-29 NOTE — Progress Notes (Signed)
Chief complaint: Follow-up hypothyroidism, Hashimoto's thyroiditis, goiter, and new problems as listed below  History of present illness: The patient is a 23 year old Caucasian young man. He was unaccompanied.  1. The patient was referred to me on 12/26/03 for evaluation of growth delay by his pediatrician, Dr. Williemae Areaon Young. The patient was 23 years old. On exam his height was at less than the 5th percentile. His weight was also less than the 5th percentile. He had a 20-25 gram goiter. His pubertal development was relatively delayed for his chronologic age. His thyroid function tests were normal. His bone age was 4413 at a chronologic age of 23. His IGF-1 was 233 with normals being 202-957. His testosterone was 64.55, which was consistent with his early pubertal status. Over the next 6 months he grew very little. A growth hormone stimulation test done on 08/24/04 showed a initial growth hormone value of 0.10 and a peak growth hormone value of 0.56 after stimulation by levodopa. A second growth hormone stimulation test on 09/16/04 showed a baseline growth hormone of 0.26 and a stimulated value of 6.97, using levodopa and propranolol as a stimulating agent. 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 He was started on growth hormone in late 2006. In January 2007 the patient was also mildly hypothyroid. At that point he was started on Synthroid 25 mcg per day. The patient continued growth hormone treatment with gradually increasing doses of growth hormone up until 02/28/08. At that point he was a senior in high school. He had achieved a height growth of 69 inches. He decided it was time to stop growth hormone. On 06/26/09, I saw the patient in follow-up. Since he had not required a dose increase of Synthroid beyond 25 mcg over the preceding 4 years, we elected to  stop the Synthroid to see if perhaps he would make more thyroid hormone on his own. By 12/31/09 his thyroid function tests showed a TSH of 1.921, free T4 of 1.08, and free T3 of 2.9. At that point we agreed to continue him off Synthroid to see whether he would require Synthroid in the future. Later, however, at his visit on 7/10.12 his TSH increased again and I re-started him on Synthroid.  2. The patient's last visit here was 05/25/12. He was taking 37.5 mcg of Synthroid at that time. I also started him on omeprazole, 20 mg, twice daily. He took the omeprazole for a while, then stopped. His GI symptoms improved over time. In the interim he had been healthy until about Xmas when he had flu-like symptoms immediately after receiving the flu shot.  A. Bloating: He'll occasionally feel some lower abdominal bloating after meals, or sometimes before meals, but much less frequent and severe than before.. These symptoms are sometimes relieved by burping.    B. Decreased appetite. His appetite is better, but still variable.    C. Weight loss:  His weight has been stable for the past two years.     D. Stomach pains and nausea: These symptoms have essentially resolved.    E. "Minor fatigue": His fatigue has resolved.   F. His only medication is Synthroid. He is not taking any colon cleansers, vitamins, minerals, or any other OTC products.   3. Pertinent Review of Systems: Constitutional: The patient feels "okay today, slightly bloated, and doesn't have too much  appetite".  Eyes: Vision is good. There are no significant eye complaints. Neck: The patient has no complaints of anterior neck swelling, soreness, tenderness,  pressure, discomfort, or difficulty swallowing.  Heart: Heart rate increases with exercise or other physical activity. The patient has no complaints of palpitations, irregular heat beats, chest pain, or chest pressure. Gastrointestinal: As above. Bowel movents occur once every 1-2 days.   Legs:  Muscle mass and strength seem normal. There are no complaints of numbness, tingling, burning, or pain. No edema is noted. Feet: There are no obvious foot problems. There are no complaints of numbness, tingling, burning, or pain. No edema is noted. 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 Romeo Apple has a cream to use, but rarely needs to use it.  Marland Kitchen  PAST MEDICAL, FAMILY, AND SOCIAL HISTORY: 1. School and work: The patient is finishing his fifth year at [employer] and will graduate soon. 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  2. Activities: He has been exercising more for the past 6 weeks.    3. Primary care provider: [facility] 4. GI: Dr. Devona Konig, [facility]  PHYSICAL EXAMINATION:  Vital signs: BP: 116/68, HR: 65 Weight: 157 lbs and 8 oz, compared to 157 and 12.8 oz. at his last visit and with 161 lbs, and 6.4 oz two years ago.  Constitutional: The patient looks healthy and appears physically and emotionally well.  Eyes: There is no arcus or proptosis.  Face: Normal.  Mouth: The oropharynx appears normal. The tongue appears normal. There is normal oral moisture. There is no obvious gingivitis. Neck: There are no bruits present. The thyroid gland appears normal in size. The thyroid gland is again approximately 23-25 grams in size. The consistency of the thyroid gland is relatively normal. There is no thyroid tenderness to palpation. Lungs: The lungs are clear. Air movement is good. Heart: The heart rhythm and rate appear normal. Heart sounds S1 and S2 are normal. I do not appreciate any pathologic heart murmurs. Abdomen: The abdominal size is normal. Bowel sounds are normal. The abdomen is soft. The abdomen is not tender to palpation. There is no obviously palpable hepatomegaly, splenomegaly, or other masses.  Arms: Muscle mass appears appropriate for age. Hands: There is no obvious tremor.  Phalangeal and metacarpophalangeal joints appear normal. Palms are normal. Nails are a bit pallid. Legs: Muscle mass appears appropriate for age. There is no edema.  Neurologic: Muscle strength is normal for age and gender  in both the upper and the lower extremities. Muscle tone appears normal. Sensation to touch is normal in the legs. Skin: Normal   LABS:   Labs 05/25/13: TSH 2.344, free T4 1.46, free T3 3.2  Labs 05/24/12: TSH 1.795, free T4 1.53, free T3 3.3; CMP normal; CBC normal; cholesterol 171, triglycerides 77, HDL 44, LDL 112  Labs 06/22/11: TSH 1.446, free T4 1.40, free T3 3.1     Labs 08/13/10: TSH 3.922, free T4 1.26, free T3 3.2.   ASSESSMENT: 1. Goiter: The thyroid gland is about the same size as last year.  2. Hypothyroid: By the laboratory tests from this past week, the patient is at about the 20% of the normal range for thyroid hormones, which is a decrease from being mid-range euthyroid last year.  3. Hashimoto's thyroiditis: This problem is currently clinically quiescent. He does appear to have some intermittent, asymptomatic flare ups of thyroiditis. 4. GI symptoms have  largely resolved.  PLAN: 1. Diagnostic: Repeat TFTs in 12 months. 2. Therapeutic: Patient will continue treatment with 37.5 mcg/day of Synthroid.  3. Patient education: We discussed the typical course of Hashimoto's thyroiditis and the requirement for larger doses of thyroid hormone over time.  4. Follow-up: We will see the patient in follow-up appointment in 12 months.  Level of Service: This visit lasted in excess of 40 minutes. More than 50% of the visit was devoted to counseling.  David StallMichael J Pearlee Arvizu

## 2013-05-29 NOTE — Patient Instructions (Signed)
Follow up visit in one year. Please have lab tests drawn two weeks before your next visit.

## 2013-11-21 ENCOUNTER — Other Ambulatory Visit: Payer: Self-pay | Admitting: "Endocrinology

## 2014-03-21 ENCOUNTER — Other Ambulatory Visit: Payer: Self-pay | Admitting: *Deleted

## 2014-03-21 DIAGNOSIS — E034 Atrophy of thyroid (acquired): Secondary | ICD-10-CM

## 2014-04-20 LAB — T4, FREE: FREE T4: 1.07 ng/dL (ref 0.80–1.80)

## 2014-04-20 LAB — TSH: TSH: 2.716 u[IU]/mL (ref 0.350–4.500)

## 2014-04-20 LAB — T3, FREE: T3, Free: 2.8 pg/mL (ref 2.3–4.2)

## 2014-04-29 ENCOUNTER — Ambulatory Visit: Payer: BC Managed Care – PPO | Admitting: "Endocrinology

## 2014-06-19 ENCOUNTER — Other Ambulatory Visit: Payer: Self-pay | Admitting: "Endocrinology

## 2014-06-27 ENCOUNTER — Encounter: Payer: Self-pay | Admitting: "Endocrinology

## 2014-06-27 ENCOUNTER — Ambulatory Visit (INDEPENDENT_AMBULATORY_CARE_PROVIDER_SITE_OTHER): Payer: 59 | Admitting: "Endocrinology

## 2014-06-27 VITALS — BP 121/80 | HR 65 | Wt 165.7 lb

## 2014-06-27 DIAGNOSIS — E063 Autoimmune thyroiditis: Secondary | ICD-10-CM | POA: Diagnosis not present

## 2014-06-27 DIAGNOSIS — E049 Nontoxic goiter, unspecified: Secondary | ICD-10-CM | POA: Diagnosis not present

## 2014-06-27 DIAGNOSIS — E038 Other specified hypothyroidism: Secondary | ICD-10-CM | POA: Diagnosis not present

## 2014-06-27 NOTE — Progress Notes (Signed)
Chief complaint: Follow-up hypothyroidism, Hashimoto's thyroiditis, goiter, and new problems as listed below  History of present illness: The patient is a 24 year old Caucasian young man. He was unaccompanied.  1. The patient was one of my [place] patients [place] in [place]. He was referred to me on 12/26/03 for evaluation of growth delay by his pediatrician, Dr. Williemae Areaon Young. The patient was 24 years old. On exam his height was at less than the 5th percentile. His weight was also less than the 5th percentile. He had a 20-25 gram goiter. His pubertal development was relatively delayed for his chronologic age. His thyroid function tests were normal. His bone age was 413 at a chronologic age of 24. His IGF-1 was 233 with normals being 202-957. His testosterone was 64.55, which was consistent with his early pubertal status. Over the next 6 months he grew very little. A growth hormone stimulation test done on 08/24/04 showed a initial growth hormone value of 0.10 and a peak growth hormone value of 0.56 after stimulation by levodopa. A second growth hormone stimulation test on 09/16/04 showed a baseline growth hormone of 0.26 and a stimulated value of 6.97, using levodopa and propranolol as a stimulating agent. 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 He was started on growth hormone injections in late 2006. In January 2007 the patient was also mildly hypothyroid. At that point he was started on Synthroid 25 mcg per day. The patient continued growth hormone treatment with gradually increasing doses of growth hormone up until 02/28/08. At that point he was a senior in high school. He had achieved a height growth of 69 inches. He decided it was time to stop growth hormone. On 06/26/09, I saw the patient in follow-up. Since he had not required a dose increase of  Synthroid beyond 25 mcg over the preceding 4 years, we elected to stop the Synthroid to see if perhaps he would make more thyroid hormone on his own. By 12/31/09 his thyroid function tests showed a TSH of 1.921, free T4 of 1.08, and free T3 of 2.9. At that point we agreed to continue him off Synthroid to see whether he would require Synthroid in the future. Later, however, at his visit on 08/18/10 his TSH increased again and I re-started him on Synthroid.  2. The patient's last visit here was 05/29/13. He was taking 37.5 mcg of Synthroid at that time and still takes that dose. Since his dyspepsia resolved after he graduated from college, he discontinued his omeprazole . In the interim he had been healthy except for a recent URI. He is exercising more.   A. Bloating, nausea, and abdominal pains: Resolved   B. Decreased appetite. Resolved   C. Weight loss:  His weight has increased in the past year.     D. "Minor fatigue": His fatigue has resolved.   F. His only medication is Synthroid. He is not taking any vitamins, minerals, or any other OTC products.   3. Pertinent Review of Systems: Constitutional: The patient feels "really well".  Eyes: Vision is good. There are no significant eye complaints. Neck: The patient has no complaints of anterior neck swelling, soreness, tenderness,  pressure, discomfort, or difficulty swallowing.  Heart: Heart rate increases with exercise or other physical activity. The patient has no complaints of palpitations, irregular heat beats, chest pain, or chest  pressure. Gastrointestinal: As above. Bowel movents occur every 1-2 days.  He has no G! Problems.Legs: Muscle mass and strength seem normal. There are no complaints of numbness, tingling, burning, or pain. No edema is noted. Feet: There are no obvious foot problems. There are no complaints of numbness, tingling, burning, or pain. No edema is noted. 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 Romeo AppleBen has a cream to use, but rarely needs to use it.    PAST MEDICAL, FAMILY, AND SOCIAL HISTORY: 1. School and work: The patient finished his fifth year at [employer] in May 2015. 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 He lives in [place].   2. Activities: He has been exercising more recently.     3. Primary care provider: None   PHYSICAL EXAMINATION:  Vital signs: BP: 121/80, HR: 65 Weight: 165 lbs, 11 oz.  Constitutional: The patient looks healthy and appears physically fit, and emotionally well. He has gained 8 pounds in the past year. He says it is all muscle.  Eyes: There is no arcus or proptosis.  Face: Normal.  Mouth: The oropharynx appears normal. The tongue appears normal. There is normal oral moisture. There is no obvious gingivitis. Neck: There are no bruits present. The strap muscles are much larger, c/w him doing more upper body muscle work. The thyroid gland appears normal in size. The thyroid gland is again probably about 23-25 grams in size. The consistency of the thyroid gland is relatively normal. There is no thyroid tenderness to palpation. Lungs: The lungs are clear. Air movement is good. Heart: The heart rhythm and rate appear normal. Heart sounds S1 and S2 are normal. I do not appreciate any pathologic heart murmurs. Abdomen: The abdomen is normal in size. Bowel sounds are normal. The abdomen is soft. The abdomen is not tender to palpation. There is no obviously palpable hepatomegaly, splenomegaly, or other masses.  Arms: Muscle mass appears appropriate for age. Hands: There is no obvious tremor. Phalangeal and metacarpophalangeal joints appear normal. Palms are normal.  Legs: Muscle mass appears appropriate for age and fitness level. There is no edema.  Neurologic: Muscle strength is normal for age and gender  in both the upper and the lower extremities. Muscle tone appears normal. Sensation to touch is normal in the legs. Skin:  Normal   LABS:   Labs 04/19/14: TSH 2.716, free T4 1.07, free T3 2.8  Labs 05/25/13: TSH 2.344, free T4 1.46, free T3 3.2  Labs 05/24/12: TSH 1.795, free T4 1.53, free T3 3.3; CMP normal; CBC normal; cholesterol 171, triglycerides 77, HDL 44, LDL 112  Labs 06/22/11: TSH 1.446, free T4 1.40, free T3 3.1     Labs 08/13/10: TSH 3.922, free T4 1.26, free T3 3.2.   ASSESSMENT: 1. Goiter: The thyroid gland is about the same size as last year.  2. Hypothyroid:   A. Although Romeo AppleBen has lost more than 50% of his original thyrocytes and became hyothyroid, he still probably has about 30-40% of his original thyrocytes remaining. Because his remaining thyrocytes still produce a fair amount of thyroid hormones, we have not needed to increase his Synthroid doses very much in the past 10 years.   B. By the laboratory tests in March he is still euthyroid, but his overall thyroid hormone levels have decreased to about the 15-20% of the normal range, which is a decrease from being mid-range euthyroid two years ago. He is slowly losing thyrocytes  over time.  3. Hashimoto's thyroiditis: This problem is currently clinically quiescent. He does appear to have some intermittent, asymptomatic flare ups of thyroiditis. 4. GI symptoms have resolved.  PLAN: 1. Diagnostic: Repeat TFTs in 12 months. 2. Therapeutic: Patient will continue treatment with 37.5 mcg/day of Synthroid.  3. Patient education: We discussed the typical course of Hashimoto's thyroiditis and the requirement for larger doses of thyroid hormone over time.  4. Follow-up: We will see the patient in follow-up appointment in 12 months if he wishes. However, if he chooses to be seen in [place], I have recommended Dr. Michiel Sites in [facility] to be his endocrinologist.   Level of Service: This visit lasted in excess of 40 minutes. More than 50% of the visit was devoted to counseling.  David Stall

## 2014-07-16 ENCOUNTER — Encounter: Payer: Self-pay | Admitting: "Endocrinology

## 2014-09-30 ENCOUNTER — Other Ambulatory Visit: Payer: Self-pay | Admitting: *Deleted

## 2014-09-30 DIAGNOSIS — E034 Atrophy of thyroid (acquired): Secondary | ICD-10-CM

## 2014-09-30 MED ORDER — SYNTHROID 25 MCG PO TABS: ORAL_TABLET | ORAL | Status: DC

## 2015-04-05 ENCOUNTER — Other Ambulatory Visit: Payer: Self-pay | Admitting: "Endocrinology

## 2015-05-15 ENCOUNTER — Other Ambulatory Visit: Payer: Self-pay | Admitting: *Deleted

## 2015-05-15 DIAGNOSIS — E034 Atrophy of thyroid (acquired): Secondary | ICD-10-CM

## 2015-07-24 LAB — T4, FREE: FREE T4: 1.3 ng/dL (ref 0.8–1.8)

## 2015-07-24 LAB — T3, FREE: T3 FREE: 2.8 pg/mL (ref 2.3–4.2)

## 2015-07-24 LAB — TSH: TSH: 2.28 mIU/L (ref 0.40–4.50)

## 2015-08-04 ENCOUNTER — Encounter: Payer: Self-pay | Admitting: "Endocrinology

## 2015-08-04 ENCOUNTER — Encounter: Payer: Self-pay | Admitting: *Deleted

## 2015-08-04 ENCOUNTER — Ambulatory Visit (INDEPENDENT_AMBULATORY_CARE_PROVIDER_SITE_OTHER): Payer: BLUE CROSS/BLUE SHIELD | Admitting: "Endocrinology

## 2015-08-04 VITALS — BP 109/67 | HR 67 | Wt 164.2 lb

## 2015-08-04 DIAGNOSIS — E063 Autoimmune thyroiditis: Secondary | ICD-10-CM | POA: Diagnosis not present

## 2015-08-04 DIAGNOSIS — E049 Nontoxic goiter, unspecified: Secondary | ICD-10-CM | POA: Diagnosis not present

## 2015-08-04 DIAGNOSIS — E038 Other specified hypothyroidism: Secondary | ICD-10-CM | POA: Diagnosis not present

## 2015-08-04 MED ORDER — SYNTHROID 25 MCG PO TABS: ORAL_TABLET | ORAL | Status: AC

## 2015-08-04 NOTE — Patient Instructions (Signed)
No follow up visit at this time since he plans to transfer his endocrine care to Dr. Michiel SitesElizabeth Holt at [facility]. Patient will call to re-schedule if needed.

## 2015-08-04 NOTE — Progress Notes (Signed)
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   History of present illness: The patient is a 25 year old Caucasian young man. He was unaccompanied.  1. Russell Meadows was one of my first patients here in [place]. He was referred to me on 12/26/03 for evaluation of growth delay by his pediatrician, Dr. Williemae Areaon Young. The patient was 25 years old. On exam his height was at less than the 5th percentile. His weight was also less than the 5th percentile. He had a 20-25 gram goiter. His pubertal development was relatively delayed for his chronologic age. His thyroid function tests were normal. His bone age was 6213 at a chronologic age of 25. His IGF-1 was 233 with normal values being 202-957. His testosterone was 64.55, which was consistent with his early pubertal status. Over the next 6 months he grew very little. A growth hormone stimulation test done on 08/24/04 showed a initial growth hormone value of 0.10 and a peak growth hormone value of 0.56 after stimulation by levodopa. A second growth hormone stimulation test on 09/16/04 showed a baseline growth hormone of 0.26 and a stimulated value of 6.97, using levodopa and propranolol as a stimulating agent. 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 He was started on growth hormone injections in late 2006. In January 2007 the patient was also mildly hypothyroid. At that point he was started on Synthroid 25 mcg per day. The patient continued growth hormone treatment with gradually increasing doses of growth hormone up until 02/28/08. At that point he was a senior in high school. He had achieved a height growth of 69 inches. He decided it was time to stop growth hormone. On 06/26/09, I saw the patient in follow-up.  Since he had not required a dose increase of Synthroid beyond 25 mcg over the preceding 4 years, we elected to stop the Synthroid to see if perhaps he would make more thyroid hormone on his own. By 12/31/09 his thyroid function tests showed a TSH of 1.921, free T4 of 1.08, and free T3 of 2.9. At that point we agreed to continue him off Synthroid to see whether he would require Synthroid in the future. Later, however, at his visit on 08/18/10 his TSH increased again and I re-started him on Synthroid.  2. The patient's last visit here was 06/27/14. He was taking 37.5 mcg of Synthroid at that time and still takes that dose. In the interim he had been healthy. He continues to have mild-moderate seasonal allergy symptoms that are treated with Allegra as needed. He does not take any other prescription medication or OTC medications. He runs 1-2 times per week, but has not been going to the gym often.     3. Pertinent Review of Systems: Constitutional: The patient feels "good".  Eyes: Vision is good. There are no significant eye complaints. Neck: The patient has no complaints of anterior neck swelling, soreness, tenderness,  pressure, discomfort, or difficulty swallowing.  Heart: Heart rate increases with exercise or other physical activity. The patient has no complaints of palpitations, irregular heat beats, chest pain, or chest pressure. Gastrointestinal: Bowel movents occur every 1-2 days.  He has no GI Problems. Legs: Muscle mass and strength seem normal. There are no complaints of numbness, tingling, burning, or pain. No edema is  noted. Feet: There are no obvious foot problems. There are no complaints of numbness, tingling, burning, or pain. No edema is noted. 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 Russell Meadows has a cream to use, but rarely needs to use it anymore.    PAST MEDICAL, FAMILY, AND SOCIAL HISTORY: 1. School and work: The patient finished his fifth year at  [employer] in May 2015. 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 He lives in [place]. He has an appointment to see Dr. Michiel Sites at [facility] in September.  2. Activities: As above. He has been exercising less often this year.      3. Primary care provider: None   PHYSICAL EXAMINATION:  Vital signs: BP: 109/67, HR: 67 Weight: 164 lbs, 3.2 oz.  Constitutional: The patient looks healthy, physically fit, and emotionally well. He has lost 1.5 pounds in the past year. Since he has been exercising less, I presume that he has lost muscle.   Eyes: There is no arcus or proptosis.  Face: Normal.  Mouth: The oropharynx appears normal. The tongue appears normal. There is normal oral moisture. There is no obvious gingivitis. Neck: There are no bruits present. The strap muscles are somewhat smaller, c/w him doing less upper body muscle work. The thyroid gland appears normal in size. The thyroid gland is probably smaller at about 22 grams in size. The consistency of the thyroid gland is relatively full. There is no thyroid tenderness to palpation. Lungs: The lungs are clear. Air movement is good. Heart: The heart rhythm and rate appear normal. Heart sounds S1 and S2 are normal. I do not appreciate any pathologic heart murmurs. Abdomen: The abdomen is normal in size. Bowel sounds are normal. The abdomen is soft. The abdomen is not tender to palpation. There is no obviously palpable hepatomegaly, splenomegaly, or other masses.  Arms: Muscle mass appears appropriate for age. Hands: There is no obvious tremor. Phalangeal and metacarpophalangeal joints appear normal. Palms are normal. Nail beds are somewhat pale.  Legs: Muscle mass appears appropriate for age and fitness level. There is no edema.  Neurologic: Muscle strength is normal for age and gender  in both the upper and the lower extremities. Muscle tone appears normal. Sensation to touch is normal in the  legs. Skin: Normal   LABS:   Labs 07/23/15: TSH 2.28, free T4 1.3, free T3 2.8  Labs 04/19/14: TSH 2.716, free T4 1.07, free T3 2.8  Labs 05/25/13: TSH 2.344, free T4 1.46, free T3 3.2  Labs 05/24/12: TSH 1.795, free T4 1.53, free T3 3.3; CMP normal; CBC normal; cholesterol 171, triglycerides 77, HDL 44, LDL 112  Labs 06/22/11: TSH 1.446, free T4 1.40, free T3 3.1     Labs 08/13/10: TSH 3.922, free T4 1.26, free T3 3.2.   ASSESSMENT: 1. Goiter: The thyroid gland is smaller today. The process of waxing and waning of thyroid gland size is c/w evolving Hashimoto's thyroiditis.  2. Hypothyroid:   A. Although Russell Meadows has lost more than 50% of his original thyrocytes and become hyothyroid, he still probably has about 30-40% of his original thyrocytes remaining. Because his remaining thyrocytes still produce a fair amount of thyroid hormones, we have not needed to increase his Synthroid doses very much in the past 10 years.   B. By the laboratory tests two weeks ago he is still euthyroid, with his overall thyroid function at about the 30% of the normal  range. His TFTs were better this month than they were last year at this time. He is still probably losing thyrocytes over time, but very slowly.  3. Hashimoto's thyroiditis: As above. This problem is currently clinically quiescent. He does appear to have occasional, intermittent, asymptomatic flare ups of thyroiditis.   PLAN: 1. Diagnostic: Repeat TFTs in 12 months. 2. Therapeutic: Patient will continue treatment with 37.5 mcg/day of Synthroid.  3. Patient education: We discussed the typical course of Hashimoto's thyroiditis and the requirement for larger doses of thyroid hormone over time.  4. Follow-up:  Since the patient wishes to be seen in [place], I have recommended Dr. Michiel SitesElizabeth Holt in [facility] to be his endocrinologist. However, I will be glad to see the patient in follow-up again in 12 months if he wishes.  Level of Service:  This visit lasted in excess of 45 minutes. More than 50% of the visit was devoted to counseling.  Russell Meadows,Russell Meadows

## 2015-08-21 ENCOUNTER — Ambulatory Visit: Payer: 59 | Admitting: "Endocrinology
